# Patient Record
Sex: Female | Born: 1970 | Race: Black or African American | Hispanic: No
Health system: Southern US, Community
[De-identification: ages and names within clinical notes are randomized; demographics above are authoritative.]

## PROBLEM LIST (undated history)

## (undated) DIAGNOSIS — E039 Hypothyroidism, unspecified: Secondary | ICD-10-CM

---

## 2007-03-25 ENCOUNTER — Ambulatory Visit: Payer: Self-pay | Admitting: Advanced Practice Midwife

## 2007-04-15 ENCOUNTER — Ambulatory Visit: Payer: Self-pay | Admitting: Advanced Practice Midwife

## 2007-06-08 ENCOUNTER — Observation Stay: Payer: Self-pay

## 2007-06-16 ENCOUNTER — Observation Stay: Payer: Self-pay

## 2007-06-18 ENCOUNTER — Observation Stay: Payer: Self-pay | Admitting: Obstetrics and Gynecology

## 2007-06-21 ENCOUNTER — Observation Stay: Payer: Self-pay | Admitting: Unknown Physician Specialty

## 2007-06-23 ENCOUNTER — Inpatient Hospital Stay: Payer: Self-pay | Admitting: Unknown Physician Specialty

## 2011-04-05 ENCOUNTER — Emergency Department: Payer: Self-pay | Admitting: Emergency Medicine

## 2014-05-05 ENCOUNTER — Encounter (HOSPITAL_COMMUNITY): Payer: Self-pay | Admitting: Emergency Medicine

## 2014-05-05 ENCOUNTER — Emergency Department (HOSPITAL_COMMUNITY): Payer: Self-pay

## 2014-05-05 ENCOUNTER — Emergency Department (HOSPITAL_COMMUNITY)
Admission: EM | Admit: 2014-05-05 | Discharge: 2014-05-05 | Disposition: A | Discharge status: Home / Self Care | Payer: Self-pay | Attending: Emergency Medicine | Admitting: Emergency Medicine

## 2014-05-05 DIAGNOSIS — Y998 Other external cause status: Secondary | ICD-10-CM | POA: Insufficient documentation

## 2014-05-05 DIAGNOSIS — T1490XA Injury, unspecified, initial encounter: Secondary | ICD-10-CM

## 2014-05-05 DIAGNOSIS — Y9301 Activity, walking, marching and hiking: Secondary | ICD-10-CM | POA: Insufficient documentation

## 2014-05-05 DIAGNOSIS — W1842XA Slipping, tripping and stumbling without falling due to stepping into hole or opening, initial encounter: Secondary | ICD-10-CM | POA: Insufficient documentation

## 2014-05-05 DIAGNOSIS — Z8639 Personal history of other endocrine, nutritional and metabolic disease: Secondary | ICD-10-CM | POA: Insufficient documentation

## 2014-05-05 DIAGNOSIS — S93402A Sprain of unspecified ligament of left ankle, initial encounter: Secondary | ICD-10-CM | POA: Insufficient documentation

## 2014-05-05 DIAGNOSIS — Y92410 Unspecified street and highway as the place of occurrence of the external cause: Secondary | ICD-10-CM | POA: Insufficient documentation

## 2014-05-05 HISTORY — DX: Hypothyroidism, unspecified: E03.9

## 2014-05-05 MED ORDER — MELOXICAM 7.5 MG PO TABS: 7.5000 mg | ORAL_TABLET | Freq: Every day | ORAL | Status: AC

## 2014-05-05 MED ORDER — IBUPROFEN 800 MG PO TABS
800.0000 mg | ORAL_TABLET | Freq: Once | ORAL | Status: AC
  Administered 2014-05-05: 800 mg via ORAL
  Filled 2014-05-05: qty 1

## 2014-05-05 NOTE — ED Notes (Signed)
Bed: WA01 Expected date:  Expected time:  Means of arrival:  Comments: EMS-ankle injury

## 2014-05-05 NOTE — ED Provider Notes (Signed)
CSN: [id number]     Arrival date & time 05/05/14  [id number] History   First MD Initiated Contact with Patient 05/05/14 1853     Chief Complaint  Patient presents with  . Ankle Injury   HPI  Patient is a 43 year old female who presents emergency room for evaluation of left ankle pain. Patient states that she was walking in the street when she stepped in hole and twisted her ankle. She states that she is unable to walk on it since that time but she has had some pain since then. Patient states that the pain is mostly over the ankle bones. Patient states that she has a history of several ankle sprains. She states that her pain is 10. She has no other injuries that she reports. She does not have a PCP. Patient is requesting some information to follow up with a PCP.  Past Medical History  Diagnosis Date  . Hypothyroidism    No past surgical history on file. No family history on file. History  Substance Use Topics  . Smoking status: Not on file  . Smokeless tobacco: Not on file  . Alcohol Use: Not on file   OB History    No data available     Review of Systems  Constitutional: Negative for fever, chills and fatigue.  Musculoskeletal: Positive for joint swelling, arthralgias and gait problem.  Neurological: Negative for numbness.  All other systems reviewed and are negative.     Allergies  Review of patient's allergies indicates no known allergies.  Home Medications   Prior to Admission medications   Medication Sig Start Date End Date Taking? Authorizing Provider  meloxicam (MOBIC) 7.5 MG tablet Take 1 tablet (7.5 mg total) by mouth daily. 05/05/14   Kowen Kluth A Forcucci, PA-C   BP 136/88 mmHg  Pulse 80  Temp(Src) 98.1 F (36.7 C)  Resp 18  SpO2 98%  LMP 04/24/2014 Physical Exam  Constitutional: She is oriented to person, place, and time. She appears well-developed and well-nourished. No distress.  HENT:  Head: Normocephalic and atraumatic.  Mouth/Throat: Oropharynx is clear  and moist. No oropharyngeal exudate.  Eyes: Conjunctivae and EOM are normal. Pupils are equal, round, and reactive to light. No scleral icterus.  Neck: Normal range of motion. Neck supple. No JVD present. No thyromegaly present.  Cardiovascular: Normal rate, regular rhythm, normal heart sounds and intact distal pulses.  Exam reveals no gallop and no friction rub.   No murmur heard. Pulses:      Dorsalis pedis pulses are 2+ on the left side.       Posterior tibial pulses are 2+ on the left side.  Pulmonary/Chest: Effort normal and breath sounds normal. No respiratory distress. She has no wheezes. She has no rales. She exhibits no tenderness.  Musculoskeletal:       Left ankle: She exhibits decreased range of motion and swelling. She exhibits no ecchymosis, no deformity, no laceration and normal pulse. Tenderness. Lateral malleolus, medial malleolus, AITFL and CF ligament tenderness found. No posterior TFL, no head of 5th metatarsal and no proximal fibula tenderness found. Achilles tendon normal.  Lymphadenopathy:    She has no cervical adenopathy.  Neurological: She is alert and oriented to person, place, and time.  Skin: Skin is warm and dry. She is not diaphoretic.  Psychiatric: She has a normal mood and affect. Her behavior is normal. Judgment and thought content normal.  Nursing note and vitals reviewed.   ED Course  Procedures (including critical care time)  Labs Review Labs Reviewed - No data to display  Imaging Review Dg Ankle Complete Left  05/05/2014   CLINICAL DATA:  Twisted ankle today, stepped in a hole  EXAM: LEFT ANKLE COMPLETE - 3+ VIEW  COMPARISON:  None.  FINDINGS: Three views of the left ankle submitted. No acute fracture or subluxation. Ankle mortise is preserved.  IMPRESSION: Negative.   Electronically Signed   By: Natasha MeadLiviu  Pop M.D.   On: 05/05/2014 19:58     EKG Interpretation None      MDM   Final diagnoses:  Injury  Left ankle sprain, initial encounter    Patient is a [age] year old female who presents emergency room for evaluation of left ankle pain. Physical exam reveals neurovascularly intact left ankle. Plain film x-rays are negative. Suspect that this is likely a ankle sprain. We'll place patient in an ASO brace and give crutches. Patient can weight-bear as tolerated. Patient to be given [facility] [place]. Patient given prescription for ibuprofen. Patient follow-up with her PCP. I have encouraged rice therapy. Patient states understanding and agreement at this time. Patient stable for discharge.    Eben Burowourtney A Forcucci, PA-C 05/05/14 2053  Gilda Creasehristopher J. Pollina, MD 05/05/14 2109

## 2014-05-05 NOTE — ED Notes (Signed)
Pt BIB EMS. Pt was walking along the road and stepped in a hole and injured her L ankle. Pt ambulated back home and called EMS. Pt has no deformity or swelling to L ankle. Rates pain 7/10. Ambulated to ambulance without difficulty per EMS.

## 2014-05-05 NOTE — Discharge Instructions (Signed)
Ankle Sprain °An ankle sprain is an injury to the strong, fibrous tissues (ligaments) that hold the bones of your ankle joint together.  °CAUSES °An ankle sprain is usually caused by a fall or by twisting your ankle. Ankle sprains most commonly occur when you step on the outer edge of your foot, and your ankle turns inward. People who participate in sports are more prone to these types of injuries.  °SYMPTOMS  °· Pain in your ankle. The pain may be present at rest or only when you are trying to stand or walk. °· Swelling. °· Bruising. Bruising may develop immediately or within 1 to 2 days after your injury. °· Difficulty standing or walking, particularly when turning corners or changing directions. °DIAGNOSIS  °Your caregiver will ask you details about your injury and perform a physical exam of your ankle to determine if you have an ankle sprain. During the physical exam, your caregiver will press on and apply pressure to specific areas of your foot and ankle. Your caregiver will try to move your ankle in certain ways. An X-ray exam may be done to be sure a bone was not broken or a ligament did not separate from one of the bones in your ankle (avulsion fracture).  °TREATMENT  °Certain types of braces can help stabilize your ankle. Your caregiver can make a recommendation for this. Your caregiver may recommend the use of medicine for pain. If your sprain is severe, your caregiver may refer you to a surgeon who helps to restore function to parts of your skeletal system (orthopedist) or a physical therapist. °HOME CARE INSTRUCTIONS  °· Apply ice to your injury for 1-2 days or as directed by your caregiver. Applying ice helps to reduce inflammation and pain. °¨ Put ice in a plastic bag. °¨ Place a towel between your skin and the bag. °¨ Leave the ice on for 15-20 minutes at a time, every 2 hours while you are awake. °· Only take over-the-counter or prescription medicines for pain, discomfort, or fever as directed by  your caregiver. °· Elevate your injured ankle above the level of your heart as much as possible for 2-3 days. °· If your caregiver recommends crutches, use them as instructed. Gradually put weight on the affected ankle. Continue to use crutches or a cane until you can walk without feeling pain in your ankle. °· If you have a plaster splint, wear the splint as directed by your caregiver. Do not rest it on anything harder than a pillow for the first 24 hours. Do not put weight on it. Do not get it wet. You may take it off to take a shower or bath. °· You may have been given an elastic bandage to wear around your ankle to provide support. If the elastic bandage is too tight (you have numbness or tingling in your foot or your foot becomes cold and blue), adjust the bandage to make it comfortable. °· If you have an air splint, you may blow more air into it or let air out to make it more comfortable. You may take your splint off at night and before taking a shower or bath. Wiggle your toes in the splint several times per day to decrease swelling. °SEEK MEDICAL CARE IF:  °· You have rapidly increasing bruising or swelling. °· Your toes feel extremely cold or you lose feeling in your foot. °· Your pain is not relieved with medicine. °SEEK IMMEDIATE MEDICAL CARE IF: °· Your toes are numb or blue. °·   You have severe pain that is increasing. °MAKE SURE YOU:  °· Understand these instructions. °· Will watch your condition. °· Will get help right away if you are not doing well or get worse. °Document Released: 05/07/2005 Document Revised: 01/30/2012 Document Reviewed: 05/19/2011 °ExitCare® Patient Information ©2015 [employer]. This information is not intended to replace advice given to you by your health care provider. Make sure you discuss any questions you have with your health care provider. ° ° °Emergency Department Resource Guide °1) Find a Doctor and Pay Out of Pocket °Although you won't have to find out who is covered  by your insurance plan, it is a good idea to ask around and get recommendations. You will then need to call the office and see if the doctor you have chosen will accept you as a new patient and what types of options they offer for patients who are self-pay. Some doctors offer discounts or will set up payment plans for their patients who do not have insurance, but you will need to ask so you aren't surprised when you get to your appointment. ° °2) Contact Your Local Health Department °Not all health departments have doctors that can see patients for sick visits, but many do, so it is worth a call to see if yours does. If you don't know where your local health department is, you can check in your phone book. The CDC also has a tool to help you locate your state's health department, and many state websites also have listings of all of their local health departments. ° °3) Find a Walk-in Clinic °If your illness is not likely to be very severe or complicated, you may want to try a walk in clinic. These are popping up all over the country in pharmacies, drugstores, and shopping centers. They're usually staffed by nurse practitioners or physician assistants that have been trained to treat common illnesses and complaints. They're usually fairly quick and inexpensive. However, if you have serious medical issues or chronic medical problems, these are probably not your best option. ° °No Primary Care Doctor: °- Call [facility] at  832-8000 - they can help you locate a primary care doctor that  accepts your insurance, provides certain services, etc. °- Physician Referral Service- 1-800-533-3463 ° °Chronic Pain Problems: °Organization         Address  Phone   Notes  °[facility]  (336) 297-2271 Patients need to be referred by their primary care doctor.  ° °Medication Assistance: °Organization         Address  Phone   Notes  °[place] [facility] [address] °[place], [place] [address] (336) 641-8030 --Must be a resident of [place] °-- Must have NO insurance coverage whatsoever (no Medicaid/ Medicare, etc.) °-- The pt. MUST have a primary care doctor that directs their care regularly and follows them in the community °  °[facility]  (866) 331-1348   °[employer]  (888) 892-1162   ° °Agencies that provide inexpensive medical care: °Organization         Address  Phone   Notes  °[facility]  (336) 832-8035   °[facility]    (336) 832-7272   °[facility] [address] °[place], [place] [address] (336) 832-4777   °[facility] [address], °[place] (336) 271-4999   °[facility]    (336) 373-0678   °[facility]    (  336) 272-1050   °[facility] ° [address], [place] Phone:  (336) 832-4444, Fax:  (336) 832-4440 Hours of Operation:  9 am - 6 pm, M-F.  Also accepts Medicaid/Medicare and self-pay.  °[facility] ° [address], [place] Phone: (336) 832-3150, Fax: (336) 832-3151. Hours of Operation:  8:30 am - 5:30 pm, M-F.  Also accepts Medicaid and self-pay.  °[facility] [place] [address], [place] Phone: (336) 878-6027   °[facility] [address], [place], [place] (336)723-1848, Ext. 123 Mondays & Thursdays: 7-9 AM.  First 15 patients are seen on a first come, first serve basis. °  ° °Medicaid-accepting [place] Providers: ° °Organization         Address  Phone   Notes  °[facility] [address], [place] (336) 641-2100 Also accepts self-pay patients.  °[facility] [address], [place] ° (336) 856-9996   °[facility] [address], [place] (336) 288-8857   °[facility] [address] [place] [address], [place] (336) 299-7000   °Veita Bland [address], [place]  ° (336) 373-1557 Only accepts [place] Access Medicaid patients after they have their name applied to their card.  ° °Self-Pay (no insurance) in [place]: ° °Organization         Address  Phone   Notes  °Sickle Cell Patients, [facility] [address], [place] (336) 832-1970   °[facility] [address], [place] (336) 832-4400   °[facility] ° [address], [place] (336) 992-4800   °[facility]/Dr. Osei-Bonsu ° [address] [place] [address], [place] or [address], [place] (336) 841-8500 Phone number for both [place] and [place] locations is the same.  °[facility] [address], [place] (336) 299-0000   °[facility] [address] [place] [address], [place] or [address] (336) 852-7530 °(336) 878-2260   °[facility] [address],  (336) 350-1642, phone; (336) 294-5005, fax Sees patients 1st and 3rd Saturday of every month.  Must not qualify for public or private insurance (i.e. Medicaid, Medicare, [place] Health Choice, Veterans' Benefits) • Household income should be no more than 200% of the poverty level •The clinic cannot treat you if you are pregnant or think you are pregnant • Sexually transmitted diseases are not treated at the clinic.  ° ° °Dental Care: °Organization         Address  Phone  Notes  °[facility] [address],  (336) 641-6152 Accepts children up to age 21 who are enrolled in Medicaid or [place] Health Choice; pregnant women with a Medicaid card; and children who have applied for Medicaid or [place] Health Choice, but were declined, whose parents can pay a reduced fee at time of service.  °[facility] [place]  [address], [place] (336) 641-7733 Accepts children up to age 21 who are enrolled  in Medicaid or  Health Choice; pregnant women with a Medicaid card; and children who have applied for Medicaid or  Health Choice, but were declined, whose parents can pay a reduced fee at time of service.  °[facility] ° [address], [place] (336) 641-4533 Patients are seen by appointment only. Walk-ins are not accepted. [facility] will see patients 18 years of age and older. °Monday - Tuesday (8am-5pm) °Most Wednesdays (8:30-5pm) °$30 per visit, cash only  °[facility] ° [address], [place] (336) 641-4533 Patients are seen by appointment only. Walk-ins are not accepted. [facility] will see patients 18 years of age and older. °One Wednesday Evening (Monthly: Volunteer Based).  $30 per visit, cash only  °[facility]  (919) 537-3737 for adults; Children under age 4, call Graduate Pediatric Dentistry at (919) [phone]. Children aged 4-14, please call (919) [phone] to request a pediatric application. ° Dental services are provided in all areas of dental care including fillings, crowns and bridges, complete and partial dentures, implants, gum treatment, root canals, and extractions. Preventive care is also provided. Treatment is provided to both adults and children. °Patients are selected via a lottery and there is often a waiting list. °  °[facility] [address], °[place] ° (336) 763-8833 [email or web address] °  °[facility] [address], [place], [place] (336)723-1848, Ext. 123 Second and Fourth Thursday of each month, opens at 6:30 AM; Clinic ends at 9 AM.  Patients are seen on a first-come first-served basis, and a limited number are seen during each clinic.  ° °[facility] ° [address], [place], [place] (336) 723-7904   Eligibility Requirements °You must have lived in [place], [place], or [place] counties for at least the last three months. °  You cannot be  eligible for state or federal sponsored healthcare insurance, including Veterans Administration, Medicaid, or Medicare. °  You generally cannot be eligible for healthcare insurance through your employer.  °  How to apply: °Eligibility screenings are held every Tuesday and Wednesday afternoon from 1:00 pm until 4:00 pm. You do not need an appointment for the interview!  °[facility] [address], [place], [place] 336-631-2330   °[facility]  336-342-8273   °[facility]  336-703-3100   °[facility]  336-570-6415   ° °Behavioral Health Resources in the Community: °Intensive Outpatient Programs °Organization         Address  Phone  Notes  °[facility] [address], [place], [place] 336-878-6098   °[facility] [address], [place], [place] 336-832-9800   °[facility] [address], [place], [place] ° 336-882-2125   °[facility] [address],  °[place], [place] 1-800-853-5163 or 336-641-4981   °Substance Abuse Resources °Organization         Address  Phone  Notes  °[facility]  336-882-2125   °[facility]  336-784-9470   °[facility]  336-285-9073   °[facility]  336-845-3988   °Residential & Outpatient Substance Abuse Program  1-800-659-3381   °Psychological Services °Organization         Address  Phone  Notes  °[facility]  336- 832-9600   °[facility]  336- 378-7881   °[facility] [address], [place] 1-800-853-5163 or 336-641-4981   ° °Mobile Crisis Teams °Organization         Address  Phone  Notes  °[facility], Mobile Crisis Care Unit  1-877-626-1772   °Assertive °[facility] ° [address] [place],    336-834-9664   °Sharon DeEsch [address] °[place] 336-554-5454   ° °Self-Help/Support Groups °Organization          Address  Phone             Notes  °[facility] [place] - variety of support groups  336- 373-1402 Call for more information  °Narcotics Anonymous (NA), [facility] [address], °[place]  2 meetings at this location  ° °Residential Treatment Programs °Organization         Address  Phone  Notes  °[facility] [address],    °[place]  1-866-801-8205   °[facility] ° [address], [place], [place] 704-293-8524   °[facility] [address], [place] 336-845-3988 Admissions: 8am-3pm M-F  °[facility] [address]    °[place], [place] 336-841-1104   °[facility] [address], [place], [place] 336-379-7146   °[facility] [address]  °[place], [place] 336-285-9073   °[facility] Intensive Outpatient [address], [place], [place] 336-852-3033   °[facility] [address]  °[place], [place] 1-877-615-2722 or 336-784-9470   °[facility] [address] [place], [place] 336-227-7417 Accepts Medicaid  °[facility] [address]  °[place] 1-800-659-3381 Substance Abuse/Addiction Treatment  ° °[place] Behavioral Health Resources °Organization         Address  Phone  Notes  °[facility]  (888) 581-9988   °Julie Brannon, PhD [address] [place], [place]   (336) 349-5553 or (336) 951-0000   °[facility]   [address] °[place], [place] (336) 349-4454   °[facility] [address], [place], [place] (336) 342-8316 Insurance/Medicaid/sponsorship through [facility]  °[facility] [address]                                    [place], [place] (336) 342-8316 Therapy/tele-psych/case  °[facility] [address]  ° [place],  (336) 349-2233    °Dr. Arfeen  (336) 349-4544   °[facility] [place]  [employer]  [place] [facility] 1) [address], [place] °2) [address], [place] °3)  [address], [place] (336) 349-3220 °(336) 342-7768 ° °(336) 342-8140   °[place] [facility] (336) 342-1394 or (336) 342-3537 (After Hours)    ° ° ° °

## 2017-07-29 ENCOUNTER — Encounter (HOSPITAL_COMMUNITY): Payer: Self-pay

## 2017-07-29 ENCOUNTER — Other Ambulatory Visit: Payer: Self-pay

## 2017-07-29 ENCOUNTER — Emergency Department (HOSPITAL_COMMUNITY)
Admission: EM | Admit: 2017-07-29 | Discharge: 2017-07-29 | Disposition: A | Discharge status: Home / Self Care | Payer: Self-pay | Attending: Emergency Medicine | Admitting: Emergency Medicine

## 2017-07-29 ENCOUNTER — Emergency Department (HOSPITAL_COMMUNITY): Payer: Self-pay

## 2017-07-29 DIAGNOSIS — R197 Diarrhea, unspecified: Secondary | ICD-10-CM | POA: Insufficient documentation

## 2017-07-29 DIAGNOSIS — M25561 Pain in right knee: Secondary | ICD-10-CM | POA: Insufficient documentation

## 2017-07-29 DIAGNOSIS — F17200 Nicotine dependence, unspecified, uncomplicated: Secondary | ICD-10-CM | POA: Insufficient documentation

## 2017-07-29 DIAGNOSIS — R110 Nausea: Secondary | ICD-10-CM | POA: Insufficient documentation

## 2017-07-29 DIAGNOSIS — R109 Unspecified abdominal pain: Secondary | ICD-10-CM | POA: Insufficient documentation

## 2017-07-29 LAB — URINALYSIS, ROUTINE W REFLEX MICROSCOPIC
Bilirubin Urine: NEGATIVE
GLUCOSE, UA: NEGATIVE mg/dL
Ketones, ur: NEGATIVE mg/dL
Nitrite: NEGATIVE
PH: 5 (ref 5.0–8.0)
Protein, ur: NEGATIVE mg/dL
Specific Gravity, Urine: 1.027 (ref 1.005–1.030)

## 2017-07-29 LAB — COMPREHENSIVE METABOLIC PANEL
ALBUMIN: 3.4 g/dL — AB (ref 3.5–5.0)
ALK PHOS: 47 U/L (ref 38–126)
ALT: 14 U/L (ref 14–54)
AST: 18 U/L (ref 15–41)
Anion gap: 8 (ref 5–15)
BILIRUBIN TOTAL: 0.7 mg/dL (ref 0.3–1.2)
BUN: 13 mg/dL (ref 6–20)
CALCIUM: 8.4 mg/dL — AB (ref 8.9–10.3)
CO2: 23 mmol/L (ref 22–32)
CREATININE: 0.83 mg/dL (ref 0.44–1.00)
Chloride: 104 mmol/L (ref 101–111)
GFR calc Af Amer: >60 mL/min (ref >60)
GFR calc non Af Amer: >60 mL/min (ref >60)
GLUCOSE: 112 mg/dL — AB (ref 65–99)
Potassium: 3.9 mmol/L (ref 3.5–5.1)
Sodium: 135 mmol/L (ref 135–145)
TOTAL PROTEIN: 7.7 g/dL (ref 6.5–8.1)

## 2017-07-29 LAB — I-STAT BETA HCG BLOOD, ED (MC, WL, AP ONLY): I-stat hCG, quantitative: <5 m[IU]/mL (ref <5)

## 2017-07-29 LAB — CBC
HCT: 36.7 % (ref 36.0–46.0)
Hemoglobin: 11.7 g/dL — ABNORMAL LOW (ref 12.0–15.0)
MCH: 27.5 pg (ref 26.0–34.0)
MCHC: 31.9 g/dL (ref 30.0–36.0)
MCV: 86.2 fL (ref 78.0–100.0)
PLATELETS: 266 10*3/uL (ref 150–400)
RBC: 4.26 MIL/uL (ref 3.87–5.11)
RDW: 16.1 % — AB (ref 11.5–15.5)
WBC: 5 10*3/uL (ref 4.0–10.5)

## 2017-07-29 LAB — LIPASE, BLOOD: Lipase: 38 U/L (ref 11–51)

## 2017-07-29 MED ORDER — IBUPROFEN 400 MG PO TABS: 400.0000 mg | ORAL_TABLET | Freq: Three times a day (TID) | ORAL | 0 refills | Status: AC | PRN

## 2017-07-29 MED ORDER — PREDNISONE 20 MG PO TABS
60.0000 mg | ORAL_TABLET | Freq: Once | ORAL | Status: AC
  Administered 2017-07-29: 60 mg via ORAL
  Filled 2017-07-29: qty 3

## 2017-07-29 MED ORDER — ONDANSETRON 4 MG PO TBDP
8.0000 mg | ORAL_TABLET | Freq: Once | ORAL | Status: AC
Start: 2017-07-29 — End: 2017-07-29
  Administered 2017-07-29: 8 mg via ORAL
  Filled 2017-07-29: qty 2

## 2017-07-29 MED ORDER — ONDANSETRON 8 MG PO TBDP: 8.0000 mg | ORAL_TABLET | Freq: Three times a day (TID) | ORAL | 0 refills | Status: AC | PRN

## 2017-07-29 MED ORDER — PREDNISONE 20 MG PO TABS: 40.0000 mg | ORAL_TABLET | Freq: Every day | ORAL | 0 refills | Status: AC

## 2017-07-29 MED ORDER — IBUPROFEN 400 MG PO TABS
600.0000 mg | ORAL_TABLET | Freq: Once | ORAL | Status: AC
  Administered 2017-07-29: 600 mg via ORAL
  Filled 2017-07-29: qty 1

## 2017-07-29 NOTE — ED Notes (Signed)
Pt states she cannot provide UA sample at this time. UA cup given.

## 2017-07-29 NOTE — ED Notes (Signed)
Pt verbalized understanding discharge instructions and denies any further needs or questions at this time. VS stable, ambulatory and steady gait.   

## 2017-07-29 NOTE — ED Notes (Signed)
No reply x2 for vitals. 

## 2017-07-29 NOTE — ED Triage Notes (Signed)
Pt reports right knee pain X3 weeks. Denies injury. Pt also reports abdominal pain and diarrhea that began this morning.

## 2017-07-29 NOTE — ED Provider Notes (Signed)
[facility] EMERGENCY DEPARTMENT Provider Note   CSN: [id number] Arrival date & time: 07/29/17  1307     History   Chief Complaint Chief Complaint  Patient presents with  . Knee Pain  . Abdominal Pain    HPI Cassie Levine is a 47 y.o. female.  HPI 47 year old female presents the emergency department with 3 weeks of ongoing right knee pain.  No injury or trauma.  No history of gout.  No fevers or chills.  She also reports this morning she developed nausea and nonbloody diarrhea.  She denies vomiting.  Reports crampy abdominal pain.  Denies fevers and chills.  No urinary complaints.  Still has menstrual cycles.  She is sexually active.  She does not use birth control.  No pelvic complaints.  No vaginal bleeding.  Pain in her right knee is worse with range of motion of her right knee and ambulation.  She states she has been limping.   Past Medical History:  Diagnosis Date  . Hypothyroidism     There are no active problems to display for this patient.   History reviewed. No pertinent surgical history.  OB History    No data available       Home Medications    Prior to Admission medications   Medication Sig Start Date End Date Taking? Authorizing Provider  ibuprofen (ADVIL,MOTRIN) 400 MG tablet Take 1 tablet (400 mg total) by mouth every 8 (eight) hours as needed. 07/29/17   Azalia Bilis, MD  meloxicam (MOBIC) 7.5 MG tablet Take 1 tablet (7.5 mg total) by mouth daily. 05/05/14   Forcucci, Courtney, PA-C  ondansetron (ZOFRAN ODT) 8 MG disintegrating tablet Take 1 tablet (8 mg total) by mouth every 8 (eight) hours as needed for nausea or vomiting. 07/29/17   Azalia Bilis, MD  predniSONE (DELTASONE) 20 MG tablet Take 2 tablets (40 mg total) by mouth daily for 5 days. 07/29/17 08/03/17  Azalia Bilis, MD    Family History History reviewed. No pertinent family history.  Social History Social History   Tobacco Use  . Smoking status: Current Every  Day Smoker    Packs/day: 0.20  . Smokeless tobacco: Never Used  Substance Use Topics  . Alcohol use: Yes    Comment: occ  . Drug use: No     Allergies   Patient has no known allergies.   Review of Systems Review of Systems  All other systems reviewed and are negative.    Physical Exam Updated Vital Signs BP 130/67   Pulse 98   Temp 98.6 F (37 C) (Oral)   Resp 16   LMP 06/27/2017 (Within Days)   SpO2 97%   Physical Exam  Constitutional: She is oriented to person, place, and time. She appears well-developed and well-nourished. No distress.  HENT:  Head: Normocephalic and atraumatic.  Eyes: EOM are normal.  Neck: Normal range of motion.  Cardiovascular: Normal rate, regular rhythm and normal heart sounds.  Pulmonary/Chest: Effort normal and breath sounds normal.  Abdominal: Soft. She exhibits no distension. There is no tenderness.  Musculoskeletal: Normal range of motion.  Full range of motion of bilateral hips, knees, ankles.  Mild pain with range of motion the right knee without obvious limitation.  Normal pulses in the right foot.  No swelling of the right lower extremity as compared to left.  No significant erythema or warmth of the right knee joint.  Neurological: She is alert and oriented to person, place, and time.  Skin:  Skin is warm and dry.  Psychiatric: She has a normal mood and affect. Judgment normal.  Nursing note and vitals reviewed.    ED Treatments / Results  Labs (all labs ordered are listed, but only abnormal results are displayed) Labs Reviewed  COMPREHENSIVE METABOLIC PANEL - Abnormal; Notable for the following components:      Result Value   Glucose, Bld 112 (*)    Calcium 8.4 (*)    Albumin 3.4 (*)    All other components within normal limits  CBC - Abnormal; Notable for the following components:   Hemoglobin 11.7 (*)    RDW 16.1 (*)    All other components within normal limits  URINALYSIS, ROUTINE W REFLEX MICROSCOPIC - Abnormal;  Notable for the following components:   APPearance HAZY (*)    Hgb urine dipstick SMALL (*)    Leukocytes, UA TRACE (*)    Bacteria, UA FEW (*)    Squamous Epithelial / LPF 6-30 (*)    All other components within normal limits  LIPASE, BLOOD  I-STAT BETA HCG BLOOD, ED ([facility], [facility], [facility] ONLY)    EKG  EKG Interpretation None       Radiology Dg Knee Complete 4 Views Right  Result Date: 07/29/2017 CLINICAL DATA:  Right knee pain x3 weeks EXAM: RIGHT KNEE - COMPLETE 4+ VIEW COMPARISON:  None. FINDINGS: No evidence of fracture, dislocation, or joint effusion. No evidence of arthropathy or other focal bone abnormality. Soft tissues are unremarkable. IMPRESSION: No acute osseous abnormality. Electronically Signed   By: Tollie Ethavid  Kwon M.D.   On: 07/29/2017 14:10    Procedures Procedures (including critical care time)  Medications Ordered in ED Medications  ibuprofen (ADVIL,MOTRIN) tablet 600 mg (not administered)  predniSONE (DELTASONE) tablet 60 mg (not administered)  ondansetron (ZOFRAN-ODT) disintegrating tablet 8 mg (8 mg Oral Given 07/29/17 1346)     Initial Impression / Assessment and Plan / ED Course  I have reviewed the triage vital signs and the nursing notes.  Pertinent labs & imaging results that were available during my care of the patient were reviewed by me and considered in my medical decision making (see chart for details).     Abdominal exam is benign.  Suspect viral illness.  In regards to the 3 weeks of right knee pain which is really what prompted her ER visit today this may represent some inflammatory arthritis.  Doubt septic arthritis.  No prior history of gout.  No significant joint effusion at this time.  X-ray without signs of arthritis or other osseous abnormalities.  Patient will need primary care follow-up.  Final Clinical Impressions(s) / ED Diagnoses   Final diagnoses:  Acute abdominal pain  Nausea  Diarrhea, unspecified type  Acute pain of right knee     ED Discharge Orders        Ordered    ibuprofen (ADVIL,MOTRIN) 400 MG tablet  Every 8 hours PRN     07/29/17 1952    ondansetron (ZOFRAN ODT) 8 MG disintegrating tablet  Every 8 hours PRN     07/29/17 1952    predniSONE (DELTASONE) 20 MG tablet  Daily     07/29/17 1952       Azalia Bilisampos, Aliana Kreischer, MD 07/29/17 1955

## 2017-07-29 NOTE — ED Provider Notes (Signed)
Patient placed in Quick Look pathway, seen and evaluated   Chief Complaint: right knee pain and nausea, abdominal pain, diarrhea.  HPI:   Right knee pain started 3 wks ago, getting worse. Worse with walking. No injuries. Abdominal pain started this morning, reports associated diarrhea, and nausea. No vomiting. Pain all over. Pt has been taking aleve, tylenol for knee with no relief. Has not tried anything for abdominal pain.   ROS: positive for knee pain, abdominal pain, nausea, diarrhea  Negative for fever, chills, vomiting, urinary symptoms.   Physical Exam:   Gen: No distress  Neuro: Awake and Alert  Skin: Warm    Focused Exam: normal appearing knee. Full ROM. Pain with flexion and extension. Joint stable. Abdomen soft, diffusely tender.   Pt with two separate complaint. Knee pain for 3 weeks, no injuries. Xray ordered. No signs of septic joint. Ambulatory. Also complaining of diffuse abdominal pain with nausea and 4 episodes of water diarrhea. No recent antibiotics or travel. States grand kids with similar complaint last week. Most likely viral. Labs ordered. zofran ordered for nausea.    Vitals:   07/29/17 1330  BP: 111/79  Pulse: (!) 104  Resp: 18  Temp: 98.6 F (37 C)  TempSrc: Oral  SpO2: 97%    Initiation of care has begun. The patient has been counseled on the process, plan, and necessity for staying for the completion/evaluation, and the remainder of the medical screening examination    Jaynie CrumbleKirichenko, Mccall Will, Cordelia Poche-C 07/29/17 1345    Azalia Bilisampos, Kevin, MD 07/29/17 1732

## 2018-12-06 ENCOUNTER — Emergency Department (HOSPITAL_COMMUNITY)
Admission: EM | Admit: 2018-12-06 | Discharge: 2018-12-06 | Disposition: A | Discharge status: Home / Self Care | Payer: Self-pay | Attending: Emergency Medicine | Admitting: Emergency Medicine

## 2018-12-06 ENCOUNTER — Encounter (HOSPITAL_COMMUNITY): Payer: Self-pay | Admitting: Emergency Medicine

## 2018-12-06 ENCOUNTER — Other Ambulatory Visit: Payer: Self-pay

## 2018-12-06 DIAGNOSIS — Z79899 Other long term (current) drug therapy: Secondary | ICD-10-CM | POA: Insufficient documentation

## 2018-12-06 DIAGNOSIS — R197 Diarrhea, unspecified: Secondary | ICD-10-CM | POA: Insufficient documentation

## 2018-12-06 DIAGNOSIS — Z20828 Contact with and (suspected) exposure to other viral communicable diseases: Secondary | ICD-10-CM | POA: Insufficient documentation

## 2018-12-06 DIAGNOSIS — R05 Cough: Secondary | ICD-10-CM | POA: Insufficient documentation

## 2018-12-06 DIAGNOSIS — R059 Cough, unspecified: Secondary | ICD-10-CM

## 2018-12-06 DIAGNOSIS — E039 Hypothyroidism, unspecified: Secondary | ICD-10-CM | POA: Insufficient documentation

## 2018-12-06 DIAGNOSIS — F17210 Nicotine dependence, cigarettes, uncomplicated: Secondary | ICD-10-CM | POA: Insufficient documentation

## 2018-12-06 MED ORDER — BENZONATATE 100 MG PO CAPS: 100.0000 mg | ORAL_CAPSULE | Freq: Three times a day (TID) | ORAL | 0 refills | Status: AC

## 2018-12-06 MED ORDER — BENZONATATE 100 MG PO CAPS: 100.0000 mg | ORAL_CAPSULE | Freq: Three times a day (TID) | ORAL | 0 refills | Status: DC

## 2018-12-06 NOTE — Discharge Instructions (Signed)
Please read and follow all provided instructions.  Your diagnoses today include:  1. Cough   2. Diarrhea, unspecified type     Tests performed today include:  Covid test - should return in 48-72 hours.  Vital signs. See below for your results today.   Medications prescribed:   Tessalon Perles - cough suppressant medication  Take any prescribed medications only as directed.  Home care instructions:  Follow any educational materials contained in this packet.  Please isolate yourself until your coronavirus test returns.  You may find your results on MyChart.  If he test positive you need to isolate for at least 10 days with at least 72 hours of improvement.  BE VERY CAREFUL not to take multiple medicines containing Tylenol (also called acetaminophen). Doing so can lead to an overdose which can damage your liver and cause liver failure and possibly death.   Follow-up instructions: Please follow-up with your primary care provider as needed for symptoms.   Return instructions:   Please return to the Emergency Department if you experience worsening symptoms.   Return with worsening chest pain, shortness of breath, trouble breathing, vomiting.  Please return if you have any other emergent concerns.  Additional Information:  Your vital signs today were: BP 138/80 (BP Location: Right Arm)    Pulse 89    Temp 98.1 F (36.7 C) (Oral)    Resp 16    Ht 5\' 9"  (1.753 m)    Wt 135.5 kg    LMP 12/05/2018    SpO2 100%    BMI 44.11 kg/m  If your blood pressure (BP) was elevated above 135/85 this visit, please have this repeated by your doctor within one month. --------------

## 2018-12-06 NOTE — ED Triage Notes (Signed)
Pt states she got her hair done on Tuesday and the salon did not require face masks. She has been coughing with runny nose and diarrhea since yesterday and feels she may have covid.

## 2018-12-06 NOTE — ED Provider Notes (Signed)
[facility] EMERGENCY DEPARTMENT Provider Note   CSN: [id number] Arrival date & time: 12/06/18  1659     History   Chief Complaint Chief Complaint  Patient presents with  . Cough    HPI Cassie Levine is a 48 y.o. female.     Patient with no past pulmonary history presents the emergency department with 3 days of cough, nasal congestion, runny nose, and diarrhea.  Patient has not had a thermometer to check her temperature but has felt warm and cold at times.  No shaking chills.  No ear pain or sore throat.  No chest pain or shortness of breath, particularly with exertion.  She denies any nausea or vomiting.  No urinary symptoms or skin rash.  Patient was at the hairdresser's the other day and states that no one was wearing a mask.  She has had no specific exposures to coronavirus.  Nobody else at home is sick.  No treatments prior to arrival.     Past Medical History:  Diagnosis Date  . Hypothyroidism     There are no active problems to display for this patient.   History reviewed. No pertinent surgical history.   OB History   No obstetric history on file.      Home Medications    Prior to Admission medications   Medication Sig Start Date End Date Taking? Authorizing Provider  benzonatate (TESSALON) 100 MG capsule Take 1 capsule (100 mg total) by mouth every 8 (eight) hours. 12/06/18   Renne CriglerGeiple, Latravia [place], PA-C  ibuprofen (ADVIL,MOTRIN) 400 MG tablet Take 1 tablet (400 mg total) by mouth every 8 (eight) hours as needed. 07/29/17   Azalia Bilisampos, Kevin, MD  meloxicam (MOBIC) 7.5 MG tablet Take 1 tablet (7.5 mg total) by mouth daily. 05/05/14   Shirleen Schirmerllis, Courtney, PA-C  ondansetron (ZOFRAN ODT) 8 MG disintegrating tablet Take 1 tablet (8 mg total) by mouth every 8 (eight) hours as needed for nausea or vomiting. 07/29/17   Azalia Bilisampos, Kevin, MD    Family History History reviewed. No pertinent family history.  Social History Social History   Tobacco Use  . Smoking status: Current  Every Day Smoker    Packs/day: 0.20  . Smokeless tobacco: Never Used  Substance Use Topics  . Alcohol use: Yes    Comment: occ  . Drug use: No     Allergies   Patient has no known allergies.   Review of Systems Review of Systems  Constitutional: Negative for chills and fever.  HENT: Positive for congestion, rhinorrhea and sneezing. Negative for sore throat.   Eyes: Negative for redness.  Respiratory: Positive for cough.   Cardiovascular: Negative for chest pain.  Gastrointestinal: Positive for diarrhea. Negative for abdominal pain, nausea and vomiting.  Genitourinary: Negative for dysuria.  Musculoskeletal: Negative for myalgias.  Skin: Negative for rash.  Neurological: Negative for headaches.     Physical Exam Updated Vital Signs BP 138/80 (BP Location: Right Arm)   Pulse 89   Temp 98.1 F (36.7 C) (Oral)   Resp 16   Ht 5\' 9"  (1.753 m)   Wt 135.5 kg   LMP 12/05/2018   SpO2 100%   BMI 44.11 kg/m   Physical Exam Vitals signs and nursing note reviewed.  Constitutional:      Appearance: She is well-developed.  HENT:     Head: Normocephalic and atraumatic.     Jaw: No trismus.     Right Ear: Tympanic membrane, ear canal and external ear normal.  Left Ear: Tympanic membrane, ear canal and external ear normal.     Nose: Mucosal edema and rhinorrhea present.     Mouth/Throat:     Mouth: Mucous membranes are not dry. No oral lesions.     Pharynx: Uvula midline. No oropharyngeal exudate, posterior oropharyngeal erythema or uvula swelling.     Tonsils: No tonsillar abscesses.  Eyes:     General:        Right eye: No discharge.        Left eye: No discharge.     Conjunctiva/sclera: Conjunctivae normal.  Neck:     Musculoskeletal: Normal range of motion and neck supple.  Cardiovascular:     Rate and Rhythm: Normal rate and regular rhythm.     Heart sounds: Normal heart sounds.  Pulmonary:     Effort: Pulmonary effort is normal. No respiratory distress.      Breath sounds: Normal breath sounds. No wheezing or rales.  Abdominal:     Palpations: Abdomen is soft.     Tenderness: There is no abdominal tenderness.  Lymphadenopathy:     Cervical: No cervical adenopathy.  Skin:    General: Skin is warm and dry.  Neurological:     Mental Status: She is alert.      ED Treatments / Results  Labs (all labs ordered are listed, but only abnormal results are displayed) Labs Reviewed  NOVEL CORONAVIRUS, NAA (HOSPITAL ORDER, SEND-OUT TO REF LAB)    EKG None  Radiology No results found.  Procedures Procedures (including critical care time)  Medications Ordered in ED Medications - No data to display   Initial Impression / Assessment and Plan / ED Course  I have reviewed the triage vital signs and the nursing notes.  Pertinent labs & imaging results that were available during my care of the patient were reviewed by me and considered in my medical decision making (see chart for details).        Patient seen and examined. Patient appears well.  She has likely a routine upper respiratory virus but cannot rule out coronavirus given her GI symptoms.  No significant shortness of breath or fever here today.  Will treat URI and cough symptomatically with Tessalon and OTC meds as desired.  Will send send-out coronavirus testing.  Patient counseled to isolate until results return.  We discussed the 10+3 rule if she tests positive.  Encouraged return to the emergency department with worsening shortness of breath, trouble breathing, vomiting, high fever.  Vital signs reviewed and are as follows: BP 138/80 (BP Location: Right Arm)   Pulse 89   Temp 98.1 F (36.7 C) (Oral)   Resp 16   Ht 5\' 9"  (1.753 m)   Wt 135.5 kg   LMP 12/05/2018   SpO2 100%   BMI 44.11 kg/m     Final Clinical Impressions(s) / ED Diagnoses   Final diagnoses:  Cough  Diarrhea, unspecified type   Well-appearing patient with URI symptoms, diarrhea.  Rule out COVID-19.   Otherwise treat symptoms symptomatically.  Return instructions as above.  Low concern for pneumonia at this point.  Patient is not febrile or hypoxic.  No signs of sepsis.  ED Discharge Orders         Ordered    benzonatate (TESSALON) 100 MG capsule  Every 8 hours     12/06/18 1749           Carlisle Cater, PA-C 12/06/18 Niotaze, [place], DO 12/07/18 1529

## 2018-12-08 LAB — NOVEL CORONAVIRUS, NAA (HOSP ORDER, SEND-OUT TO REF LAB; TAT 18-24 HRS): SARS-CoV-2, NAA: NOT DETECTED

## 2019-06-15 ENCOUNTER — Other Ambulatory Visit: Payer: Self-pay

## 2019-07-23 ENCOUNTER — Ambulatory Visit: Payer: Self-pay | Attending: Internal Medicine

## 2020-01-07 IMAGING — DX DG KNEE COMPLETE 4+V*R*
4 series · 4 of 4 positions shown · non-contrast
Comparison: None.

CLINICAL DATA: Right knee pain x3 weeks

EXAM:
RIGHT KNEE - COMPLETE 4+ VIEW

[t knee ap right]
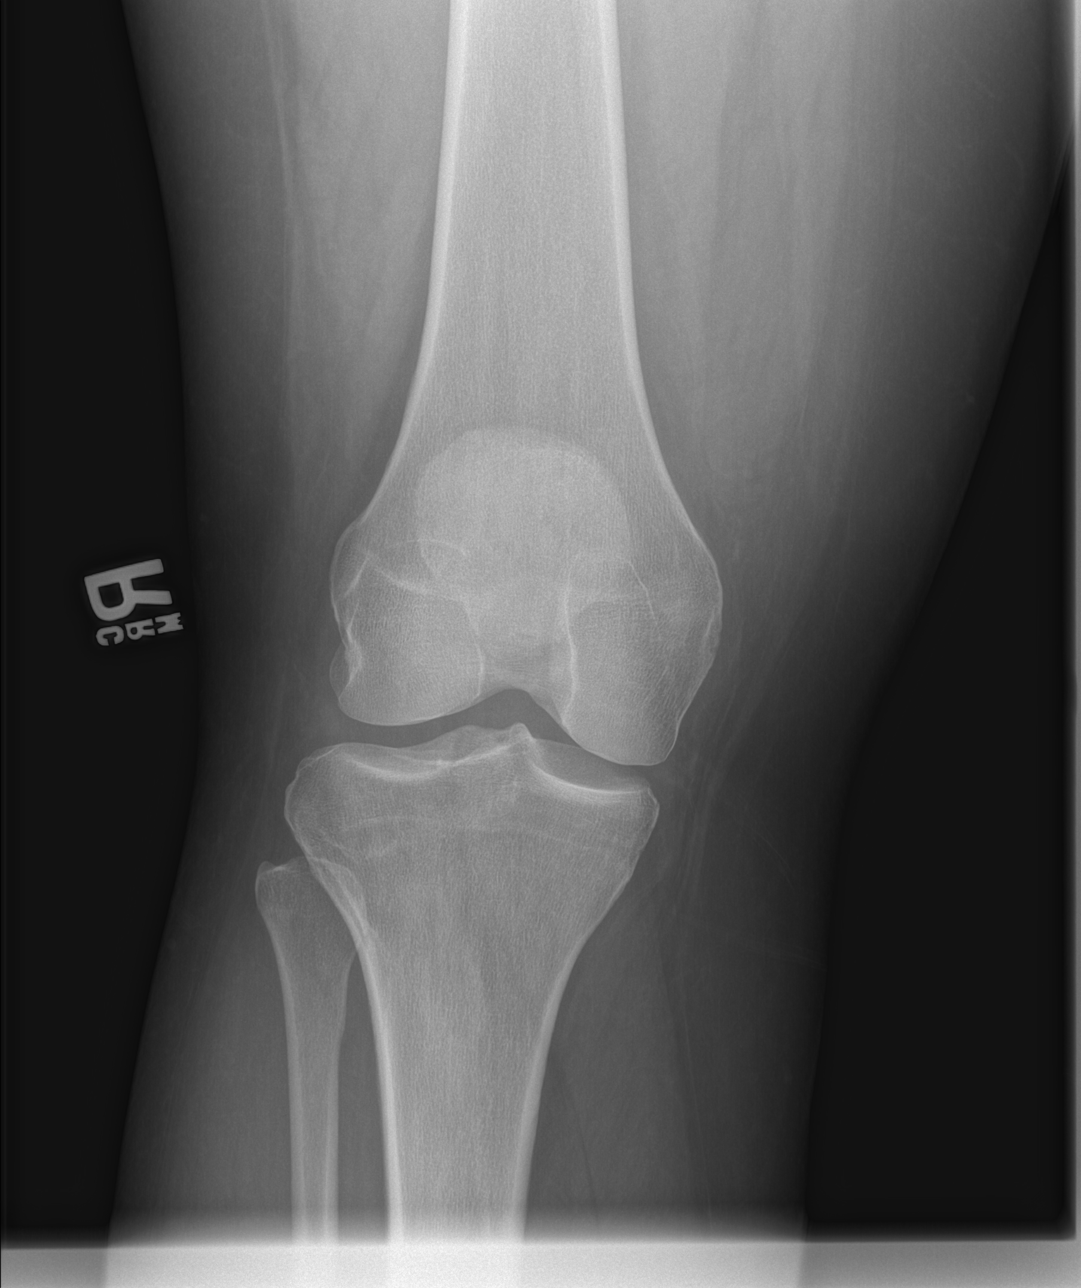

[t knee obl right (1 of 2)]
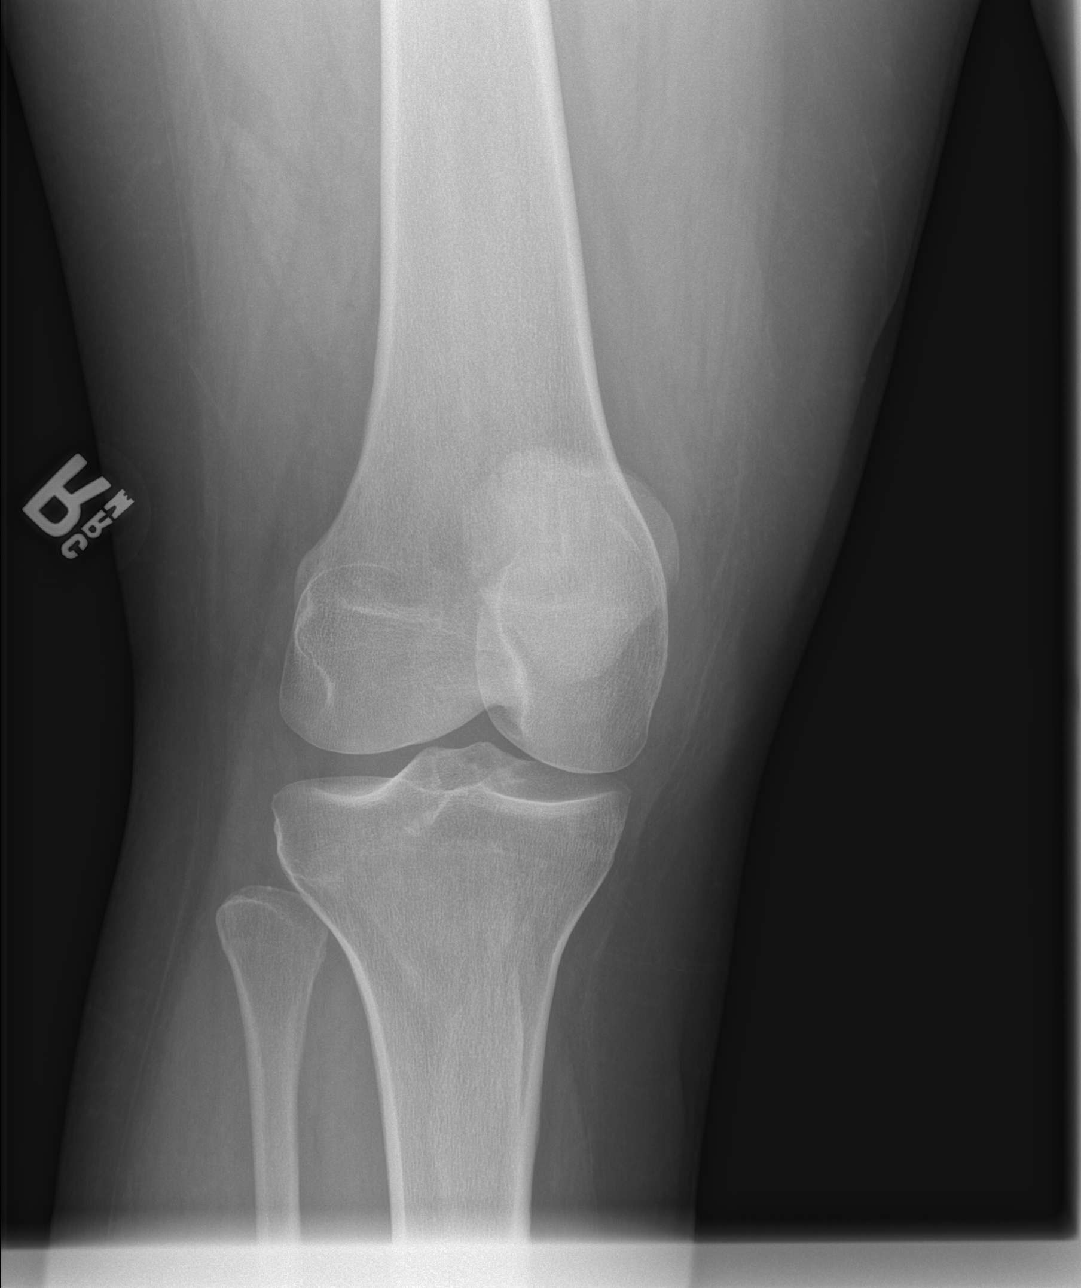

[t knee obl right (2 of 2)]
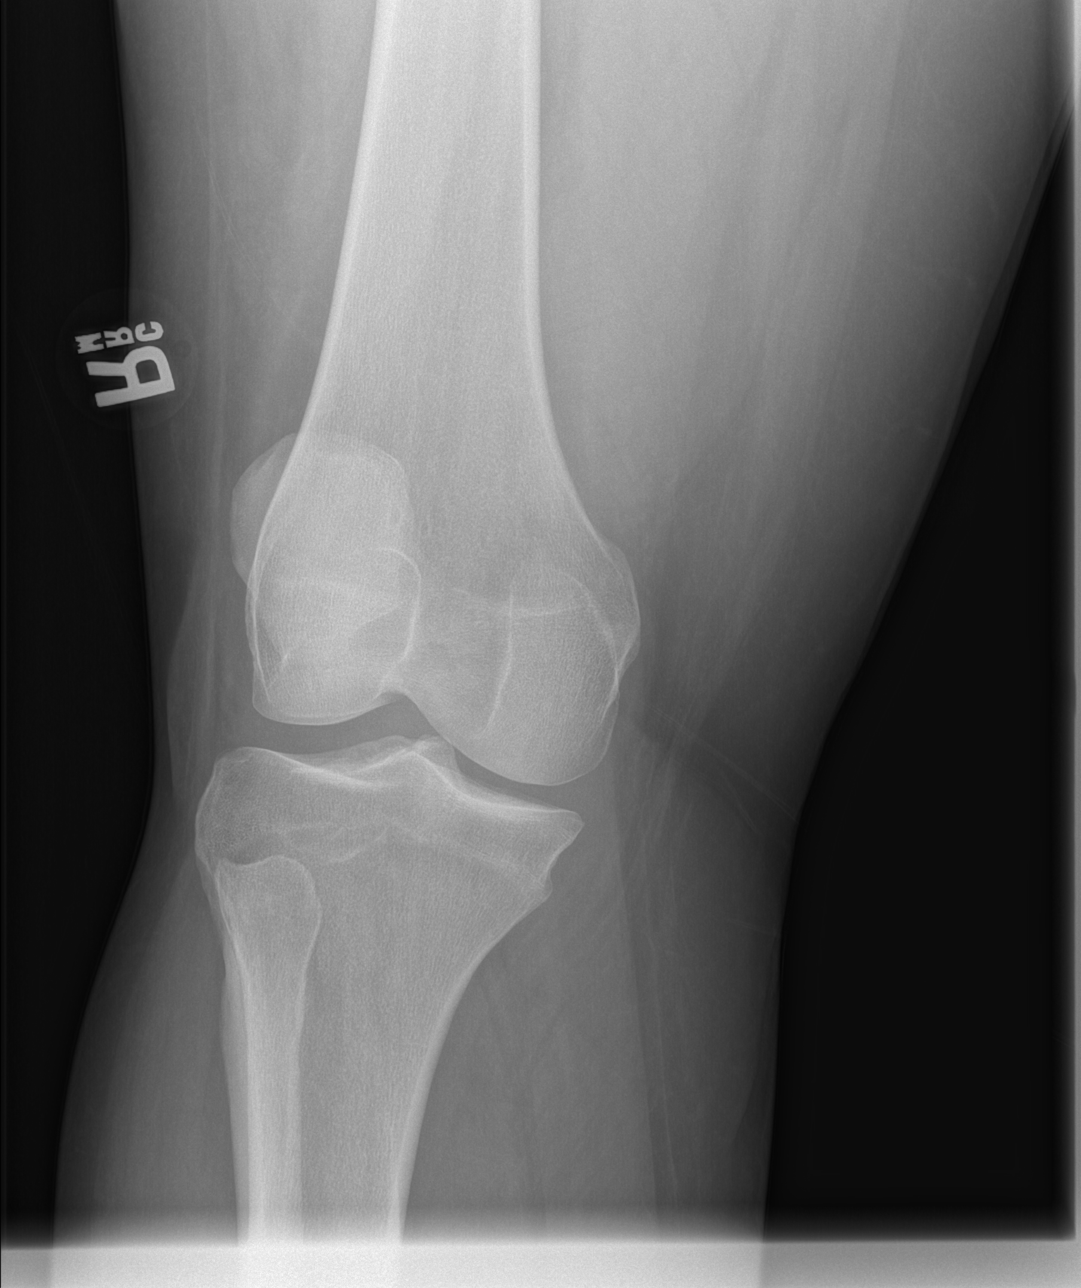

[t knee lat right]
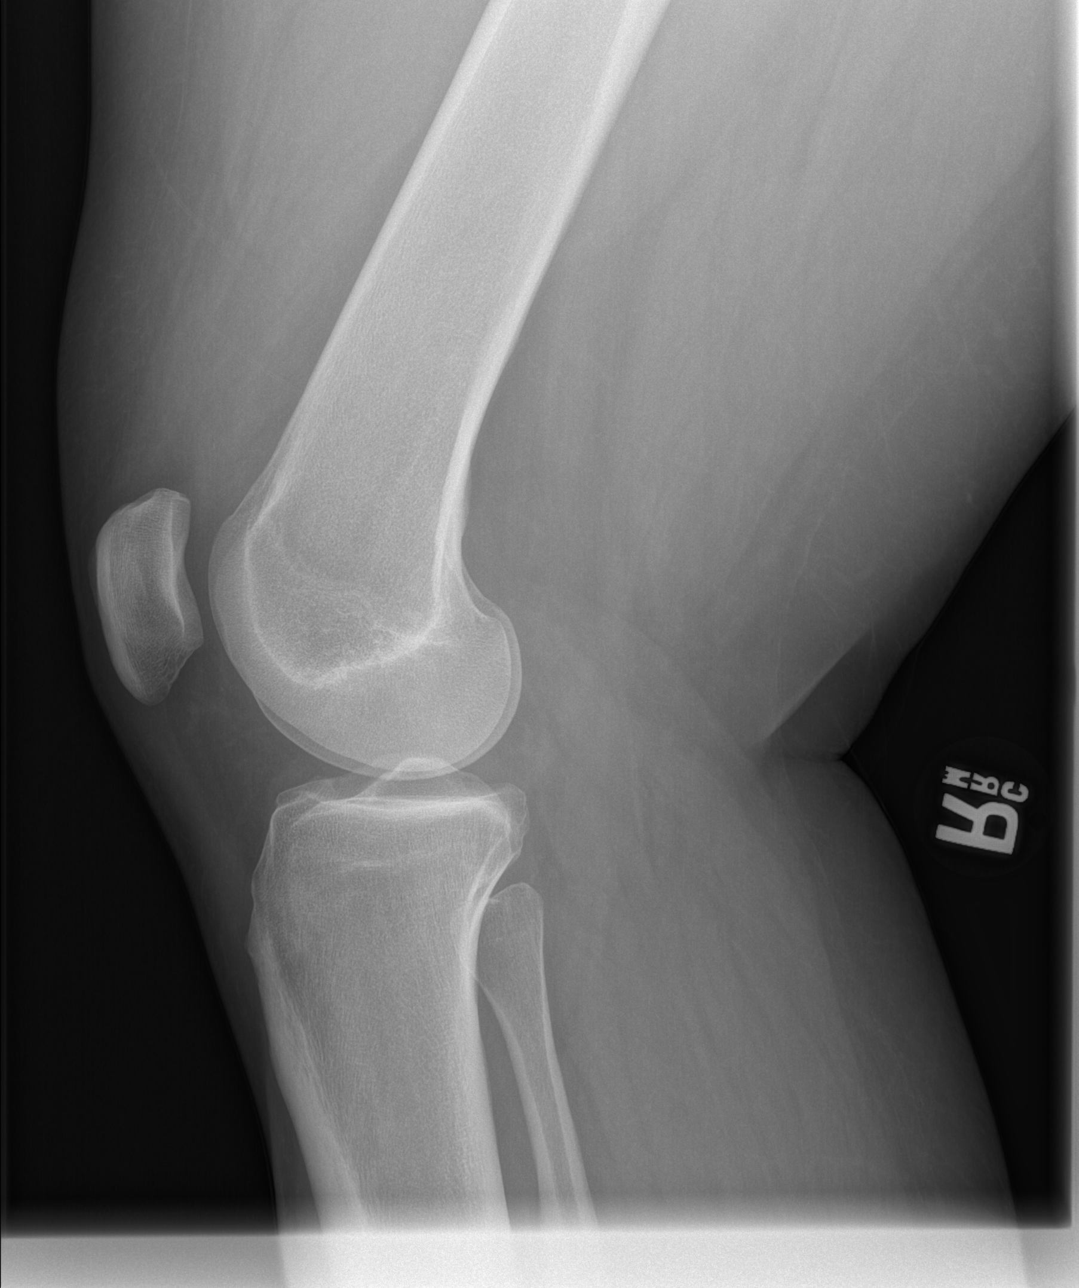

[4 of 4 positions shown; findings below may reference images not displayed]

FINDINGS: No evidence of fracture, dislocation, or joint effusion. No evidence
of arthropathy or other focal bone abnormality. Soft tissues are
unremarkable.
IMPRESSION: No acute osseous abnormality.

## 2021-11-30 ENCOUNTER — Ambulatory Visit: Payer: Self-pay | Admitting: Family Medicine

## 2022-01-31 ENCOUNTER — Ambulatory Visit: Payer: Self-pay | Admitting: Family Medicine

## 2022-02-21 NOTE — Congregational Nurse Program (Signed)
  Dept: [place] Nurse Program Note  Date of Encounter: 02/21/2022  Past Medical History: No past medical history on file.  Encounter Details:  CNP Questionnaire - 02/21/22 1423       Questionnaire   Ask client: Do you give verbal consent for me to treat you today? Yes    Student Assistance N/A    Location Patient Film/video editor, [employer]    Visit Setting with Client Organization    Patient Status Unhoused    Insurance Uninsured ([facility]/Care Connects/Self-Pay/Medicaid Family Planning)    Insurance/Financial Assistance Referral [facility]    Medication N/A    Medical Provider No    Screening Referrals Made N/A    Medical Referrals Made [facility]    Medical Appointment Made N/A    Recently w/o PCP, now 1st time PCP visit completed due to CNs referral or appointment made N/A    Food Have Food Insecurities    Transportation Need transportation assistance    Housing/Utilities No permanent housing    Interpersonal Safety Do not feel safe at current residence    Interventions [facility];Counsel    Abnormal to Normal Screening Since Last CN Visit N/A    Screenings CN Performed N/A    Sent Client to Lab for: N/A    Did client attend any of the following based off CNs referral or appointments made? N/A    ED Visit Averted N/A    Life-Saving Intervention Made N/A             Client into nurse only clinic requesting assistance with glasses. Agrees to visit. Is homeless "couch-surfing" with friend/family. Does not feel entirely safe @ the current [employer] apt. Friend providing transportation. In a hurry today so will return in 1 week for screenings and assistance with [facility] application. Completed and submitted [facility] [place] application for Vision resources. Scheduled return visit in 1 week to address other identified needs. Rhermann, RN

## 2022-02-28 DIAGNOSIS — Z139 Encounter for screening, unspecified: Secondary | ICD-10-CM

## 2022-02-28 LAB — GLUCOSE, POCT (MANUAL RESULT ENTRY): POC Glucose: 85 mg/dl (ref 70–99)

## 2022-02-28 NOTE — Congregational Nurse Program (Signed)
  Dept: [place] Nurse Program Note  Date of Encounter: 02/28/2022  Past Medical History: No past medical history on file.  Encounter Details:  CNP Questionnaire - 02/28/22 1145       Questionnaire   Ask client: Do you give verbal consent for me to treat you today? Yes    Student Assistance N/A    Location Patient Film/video editor, [employer]    Visit Setting with Client Organization    Patient Status Unhoused    Insurance Uninsured ([facility] [employer]/Care Connects/Self-Pay/Medicaid Family Planning)    Insurance/Financial Assistance Referral [facility] Financial Assistance    Medication Have Medication Insecurities    Medical Provider No    Screening Referrals Made Annual Wellness Visit    Medical Referrals Made [facility] PCP/Clinic    Medical Appointment Made N/A    Recently w/o PCP, now 1st time PCP visit completed due to CNs referral or appointment made N/A    Food Have Food Insecurities    Transportation Need transportation assistance    Housing/Utilities No permanent housing    Interpersonal Safety Do not feel safe at current residence    Interventions [facility]/Support;[facility]igate [facility];Educate    Abnormal to Normal Screening Since Last CN Visit N/A    Screenings CN Performed Blood Pressure;Blood Glucose    Sent Client to Lab for: N/A    Did client attend any of the following based off CNs referral or appointments made? N/A    ED Visit Averted N/A    Life-Saving Intervention Made N/A           Client into nurse only clinic to have BP and glucose screening and to complete applications for [facility] clinics. Forms completed and delivered. Client counseled to provide food stamp eligibility letter for open door application. Uninsured. Co re: upcoming changes in Medicaid and referred to apply in December. States she has no food so has not eaten yet today. Pantry visit today plus provided information about other food  pantries in community. Glucose 85 fasting today. Co re results. BP 142/82. RTC in 1-2 wks to follow up on last week's vision referral and repeat BP screening. Co. Re: upcoming flu shot clinic. Olene Craven, RN

## 2022-09-07 ENCOUNTER — Encounter: Payer: Self-pay | Admitting: Emergency Medicine

## 2022-09-07 ENCOUNTER — Emergency Department: Payer: 59

## 2022-09-07 ENCOUNTER — Emergency Department
Admission: EM | Admit: 2022-09-07 | Discharge: 2022-09-08 | Disposition: A | Discharge status: Home / Self Care | Payer: 59 | Attending: Emergency Medicine | Admitting: Emergency Medicine

## 2022-09-07 DIAGNOSIS — M25561 Pain in right knee: Secondary | ICD-10-CM

## 2022-09-07 DIAGNOSIS — R0789 Other chest pain: Secondary | ICD-10-CM

## 2022-09-07 DIAGNOSIS — Z743 Need for continuous supervision: Secondary | ICD-10-CM | POA: Diagnosis not present

## 2022-09-07 DIAGNOSIS — S80919A Unspecified superficial injury of unspecified knee, initial encounter: Secondary | ICD-10-CM | POA: Diagnosis not present

## 2022-09-07 DIAGNOSIS — M25562 Pain in left knee: Secondary | ICD-10-CM | POA: Insufficient documentation

## 2022-09-07 DIAGNOSIS — R079 Chest pain, unspecified: Secondary | ICD-10-CM | POA: Diagnosis not present

## 2022-09-07 DIAGNOSIS — E039 Hypothyroidism, unspecified: Secondary | ICD-10-CM | POA: Diagnosis not present

## 2022-09-07 DIAGNOSIS — Z23 Encounter for immunization: Secondary | ICD-10-CM | POA: Diagnosis not present

## 2022-09-07 DIAGNOSIS — Y92410 Unspecified street and highway as the place of occurrence of the external cause: Secondary | ICD-10-CM | POA: Insufficient documentation

## 2022-09-07 HISTORY — DX: Hypothyroidism, unspecified: E03.9

## 2022-09-07 MED ORDER — ONDANSETRON HCL 4 MG/2ML IJ SOLN
4.0000 mg | Freq: Once | INTRAMUSCULAR | Status: AC
  Administered 2022-09-08: 4 mg via INTRAVENOUS
  Filled 2022-09-07: qty 2

## 2022-09-07 MED ORDER — MORPHINE SULFATE (PF) 4 MG/ML IV SOLN
4.0000 mg | Freq: Once | INTRAVENOUS | Status: AC
  Administered 2022-09-08: 4 mg via INTRAVENOUS
  Filled 2022-09-07: qty 1

## 2022-09-07 MED ORDER — SODIUM CHLORIDE 0.9 % IV BOLUS (SEPSIS)
1000.0000 mL | Freq: Once | INTRAVENOUS | Status: AC
  Administered 2022-09-08: 1000 mL via INTRAVENOUS

## 2022-09-07 MED ORDER — TETANUS-DIPHTH-ACELL PERTUSSIS 5-2.5-18.5 LF-MCG/0.5 IM SUSY
0.5000 mL | PREFILLED_SYRINGE | Freq: Once | INTRAMUSCULAR | Status: AC
  Administered 2022-09-08: 0.5 mL via INTRAMUSCULAR
  Filled 2022-09-07: qty 0.5
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
## 2022-09-08 ENCOUNTER — Emergency Department: Payer: No Typology Code available for payment source

## 2022-09-08 LAB — CBC WITH DIFFERENTIAL/PLATELET
Abs Immature Granulocytes: 0.04 10*3/uL (ref 0.00–0.07)
Basophils Absolute: 0 10*3/uL (ref 0.0–0.1)
Basophils Relative: 0 %
Eosinophils Absolute: 0 10*3/uL (ref 0.0–0.5)
Eosinophils Relative: 0 %
HCT: 31.8 % — ABNORMAL LOW (ref 36.0–46.0)
Hemoglobin: 9.6 g/dL — ABNORMAL LOW (ref 12.0–15.0)
Immature Granulocytes: 0 %
Lymphocytes Relative: 14 %
Lymphs Abs: 1.3 10*3/uL (ref 0.7–4.0)
MCH: 24.7 pg — ABNORMAL LOW (ref 26.0–34.0)
MCHC: 30.2 g/dL (ref 30.0–36.0)
MCV: 81.7 fL (ref 80.0–100.0)
Monocytes Absolute: 0.7 10*3/uL (ref 0.1–1.0)
Monocytes Relative: 7 %
Neutro Abs: 7.7 10*3/uL (ref 1.7–7.7)
Neutrophils Relative %: 79 %
Platelets: 329 10*3/uL (ref 150–400)
RBC: 3.89 MIL/uL (ref 3.87–5.11)
RDW: 17.6 % — ABNORMAL HIGH (ref 11.5–15.5)
WBC: 9.8 10*3/uL (ref 4.0–10.5)
nRBC: 0 % (ref 0.0–0.2)

## 2022-09-08 LAB — COMPREHENSIVE METABOLIC PANEL
ALT: 15 U/L (ref 0–44)
AST: 21 U/L (ref 15–41)
Albumin: 3.6 g/dL (ref 3.5–5.0)
Alkaline Phosphatase: 47 U/L (ref 38–126)
Anion gap: 7 (ref 5–15)
BUN: 11 mg/dL (ref 6–20)
CO2: 24 mmol/L (ref 22–32)
Calcium: 8.8 mg/dL — ABNORMAL LOW (ref 8.9–10.3)
Chloride: 105 mmol/L (ref 98–111)
Creatinine, Ser: 0.86 mg/dL (ref 0.44–1.00)
GFR, Estimated: >60 mL/min (ref >60)
Glucose, Bld: 103 mg/dL — ABNORMAL HIGH (ref 70–99)
Potassium: 3.6 mmol/L (ref 3.5–5.1)
Sodium: 136 mmol/L (ref 135–145)
Total Bilirubin: 0.5 mg/dL (ref 0.3–1.2)
Total Protein: 8.7 g/dL — ABNORMAL HIGH (ref 6.5–8.1)

## 2022-09-08 LAB — TROPONIN I (HIGH SENSITIVITY): Troponin I (High Sensitivity): 6 ng/L (ref <18)

## 2022-09-08 MED ORDER — HYDROMORPHONE HCL 1 MG/ML IJ SOLN
1.0000 mg | Freq: Once | INTRAMUSCULAR | Status: AC
  Administered 2022-09-08: 1 mg via INTRAVENOUS
  Filled 2022-09-08: qty 1

## 2022-09-08 MED ORDER — ONDANSETRON 4 MG PO TBDP: 4.0000 mg | ORAL_TABLET | Freq: Four times a day (QID) | ORAL | 0 refills | Status: AC | PRN

## 2022-09-08 MED ORDER — IOHEXOL 300 MG/ML  SOLN
100.0000 mL | Freq: Once | INTRAMUSCULAR | Status: AC | PRN
  Administered 2022-09-08: 100 mL via INTRAVENOUS

## 2022-09-08 MED ORDER — IBUPROFEN 800 MG PO TABS: 800.0000 mg | ORAL_TABLET | Freq: Three times a day (TID) | ORAL | 0 refills | Status: AC | PRN

## 2022-09-08 MED ORDER — OXYCODONE-ACETAMINOPHEN 5-325 MG PO TABS: 2.0000 | ORAL_TABLET | Freq: Four times a day (QID) | ORAL | 0 refills | Status: AC | PRN

## 2022-09-08 NOTE — ED Notes (Signed)
E-signature pad unavailable - Pt verbalized understanding of D/C information - no additional concerns at this time.  

## 2022-09-08 NOTE — Discharge Instructions (Signed)

## 2022-10-23 ENCOUNTER — Ambulatory Visit: Payer: Self-pay

## 2022-11-20 ENCOUNTER — Ambulatory Visit: Payer: Self-pay

## 2023-01-31 ENCOUNTER — Ambulatory Visit: Payer: Self-pay

## 2023-02-19 ENCOUNTER — Ambulatory Visit: Payer: Self-pay | Admitting: Family Medicine

## 2023-02-19 ENCOUNTER — Ambulatory Visit: Payer: Self-pay

## 2023-02-19 ENCOUNTER — Encounter: Payer: Self-pay | Admitting: Family Medicine

## 2023-02-19 DIAGNOSIS — Z87898 Personal history of other specified conditions: Secondary | ICD-10-CM

## 2023-02-19 DIAGNOSIS — Z113 Encounter for screening for infections with a predominantly sexual mode of transmission: Secondary | ICD-10-CM

## 2023-02-19 DIAGNOSIS — A599 Trichomoniasis, unspecified: Secondary | ICD-10-CM

## 2023-02-19 LAB — HM HIV SCREENING LAB: HM HIV Screening: NEGATIVE

## 2023-02-19 LAB — WET PREP FOR TRICH, YEAST, CLUE
Trichomonas Exam: POSITIVE — AB
Yeast Exam: NEGATIVE

## 2023-02-19 MED ORDER — METRONIDAZOLE 500 MG PO TABS: 500.0000 mg | ORAL_TABLET | Freq: Two times a day (BID) | ORAL | Status: AC

## 2023-02-19 NOTE — Progress Notes (Signed)
Pt is here for STD screening.  Wet mount results reviewed.  The patient was dispensed Metronidazole 500 mg #14 today. I provided counseling today regarding the medication. We discussed the medication, the side effects and when to call clinic. Patient given the opportunity to ask questions. Questions answered.  .mecrd
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
## 2023-02-19 NOTE — Addendum Note (Signed)
Addended by: Lenice Llamas on: 02/19/2023 04:04 PM   Modules accepted: Orders

## 2023-02-28 ENCOUNTER — Telehealth: Payer: Self-pay

## 2023-02-28 NOTE — Telephone Encounter (Signed)
Unable to accept calls at this time.

## 2023-03-05 ENCOUNTER — Telehealth: Payer: Self-pay

## 2023-03-05 NOTE — Telephone Encounter (Signed)
Unable to accept calls at this time. Letter mailed. Berdie Ogren, RN

## 2023-05-02 NOTE — Progress Notes (Signed)
05-02-2023 [facility] ([facility]) received a request for medical records from [employer] for patient Cassie Levine, dob: [birth year] for medical records dates of service 05-20-2021 to present. 04-11-2023 Per request, [facility] medical records faxed to [employer] at  fax number [phone]. [facility] records released include office visit notes, HIV, Chlamydia, GC lab report from service date 02-19-2023. Records faxed by [facility] medical records clerk [place]. See copy of ROI scanned in record. Herby Abraham RN.

## 2023-07-20 ENCOUNTER — Emergency Department: Payer: Self-pay

## 2023-07-20 ENCOUNTER — Other Ambulatory Visit: Payer: Self-pay

## 2023-07-20 ENCOUNTER — Emergency Department
Admission: EM | Admit: 2023-07-20 | Discharge: 2023-07-20 | Disposition: A | Discharge status: Home / Self Care | Payer: Self-pay | Attending: Emergency Medicine | Admitting: Emergency Medicine

## 2023-07-20 DIAGNOSIS — J40 Bronchitis, not specified as acute or chronic: Secondary | ICD-10-CM | POA: Diagnosis not present

## 2023-07-20 DIAGNOSIS — B009 Herpesviral infection, unspecified: Secondary | ICD-10-CM | POA: Insufficient documentation

## 2023-07-20 DIAGNOSIS — R0789 Other chest pain: Secondary | ICD-10-CM | POA: Diagnosis not present

## 2023-07-20 DIAGNOSIS — E039 Hypothyroidism, unspecified: Secondary | ICD-10-CM | POA: Diagnosis not present

## 2023-07-20 DIAGNOSIS — R0602 Shortness of breath: Secondary | ICD-10-CM | POA: Diagnosis not present

## 2023-07-20 DIAGNOSIS — I2693 Single subsegmental pulmonary embolism without acute cor pulmonale: Secondary | ICD-10-CM | POA: Diagnosis not present

## 2023-07-20 DIAGNOSIS — J209 Acute bronchitis, unspecified: Secondary | ICD-10-CM | POA: Diagnosis not present

## 2023-07-20 LAB — COMPREHENSIVE METABOLIC PANEL
ALT: 16 U/L (ref 0–44)
AST: 25 U/L (ref 15–41)
Albumin: 3.5 g/dL (ref 3.5–5.0)
Alkaline Phosphatase: 49 U/L (ref 38–126)
Anion gap: 6 (ref 5–15)
BUN: 8 mg/dL (ref 6–20)
CO2: 26 mmol/L (ref 22–32)
Calcium: 8.7 mg/dL — ABNORMAL LOW (ref 8.9–10.3)
Chloride: 102 mmol/L (ref 98–111)
Creatinine, Ser: 0.78 mg/dL (ref 0.44–1.00)
GFR, Estimated: >60 mL/min (ref >60)
Glucose, Bld: 110 mg/dL — ABNORMAL HIGH (ref 70–99)
Potassium: 3.9 mmol/L (ref 3.5–5.1)
Sodium: 134 mmol/L — ABNORMAL LOW (ref 135–145)
Total Bilirubin: 1.4 mg/dL — ABNORMAL HIGH (ref 0.0–1.2)
Total Protein: 8.4 g/dL — ABNORMAL HIGH (ref 6.5–8.1)

## 2023-07-20 LAB — CBC WITH DIFFERENTIAL/PLATELET
Abs Immature Granulocytes: 0.06 10*3/uL (ref 0.00–0.07)
Basophils Absolute: 0 10*3/uL (ref 0.0–0.1)
Basophils Relative: 0 %
Eosinophils Absolute: 0 10*3/uL (ref 0.0–0.5)
Eosinophils Relative: 1 %
HCT: 30.6 % — ABNORMAL LOW (ref 36.0–46.0)
Hemoglobin: 9.6 g/dL — ABNORMAL LOW (ref 12.0–15.0)
Immature Granulocytes: 1 %
Lymphocytes Relative: 19 %
Lymphs Abs: 1.2 10*3/uL (ref 0.7–4.0)
MCH: 26.4 pg (ref 26.0–34.0)
MCHC: 31.4 g/dL (ref 30.0–36.0)
MCV: 84.1 fL (ref 80.0–100.0)
Monocytes Absolute: 0.5 10*3/uL (ref 0.1–1.0)
Monocytes Relative: 7 %
Neutro Abs: 4.4 10*3/uL (ref 1.7–7.7)
Neutrophils Relative %: 72 %
Platelets: 307 10*3/uL (ref 150–400)
RBC: 3.64 MIL/uL — ABNORMAL LOW (ref 3.87–5.11)
RDW: 16.9 % — ABNORMAL HIGH (ref 11.5–15.5)
WBC: 6.1 10*3/uL (ref 4.0–10.5)
nRBC: 0 % (ref 0.0–0.2)

## 2023-07-20 LAB — TROPONIN I (HIGH SENSITIVITY): Troponin I (High Sensitivity): 4 ng/L (ref <18)

## 2023-07-20 LAB — D-DIMER, QUANTITATIVE: D-Dimer, Quant: 0.57 ug{FEU}/mL — ABNORMAL HIGH (ref 0.00–0.50)

## 2023-07-20 MED ORDER — VALACYCLOVIR HCL 1 G PO TABS: 1000.0000 mg | ORAL_TABLET | Freq: Three times a day (TID) | ORAL | 0 refills | Status: AC

## 2023-07-20 MED ORDER — PREDNISONE 10 MG PO TABS: 50.0000 mg | ORAL_TABLET | Freq: Every day | ORAL | 0 refills | Status: AC

## 2023-07-20 MED ORDER — IOHEXOL 350 MG/ML SOLN
75.0000 mL | Freq: Once | INTRAVENOUS | Status: AC | PRN
  Administered 2023-07-20: 75 mL via INTRAVENOUS

## 2023-07-20 MED ORDER — RIVAROXABAN (XARELTO) VTE STARTER PACK (15 & 20 MG): ORAL_TABLET | ORAL | 0 refills | Status: AC

## 2023-07-20 NOTE — ED Notes (Signed)
 See triage notes. Patient c/o facial swelling and oral pain since yesterday. Patient also c/o mild shortness of breath and raspy/gravelly voice since this morning.

## 2023-07-20 NOTE — ED Notes (Signed)
 Patient called out. Patient had dropped her cell phone on the floor and needed help retrieving it. Patient also wanted the other side rail up on her bed. Both needs/wants were provided to the patient.

## 2023-07-20 NOTE — ED Triage Notes (Signed)
 Left jaw pain and swelling since yesterday. Denies any injury or trauma

## 2023-07-20 NOTE — ED Provider Notes (Signed)
 [facility] Provider Note    Event Date/Time   First MD Initiated Contact with Patient 07/20/23 1145     (approximate)   History   Facial Swelling   HPI  Cassie Levine is a 53 y.o. female  with history of hypothyroidism and as listed in EMR presents to the emergency department for evaluation of shortness of breath and left side rib pain that started this morning. Pain worsens with deep breath and movement. She is also having soreness and swelling on the inside of her left cheek and tongue that seems to be worse today than yesterday. No dental pain.  Also of note, patient is under a significant amount of stress and sadness due to recent death of daughter.     Physical Exam   Triage Vital Signs: ED Triage Vitals  Encounter Vitals Group     BP 07/20/23 1118 127/82     Systolic BP Percentile --      Diastolic BP Percentile --      Pulse Rate 07/20/23 1118 78     Resp 07/20/23 1118 17     Temp 07/20/23 1118 98.7 F (37.1 C)     Temp Source 07/20/23 1118 Oral     SpO2 07/20/23 1118 100 %     Weight --      Height 07/20/23 1120 6' (1.829 m)     Head Circumference --      Peak Flow --      Pain Score 07/20/23 1119 10     Pain Loc --      Pain Education --      Exclude from Growth Chart --     Most recent vital signs: Vitals:   07/20/23 1118 07/20/23 1425  BP: 127/82 124/82  Pulse: 78 76  Resp: 17 17  Temp: 98.7 F (37.1 C)   SpO2: 100% 100%    General: Awake, no distress.  CV:  Good peripheral perfusion.  Resp:  Normal effort. Breath sounds clear. No indication of pleuritic pain during exam. Abd:  No distention. Non-tender Other:  Lesions appearing as HSV 1 on left buccal surface, tongue, and lower lip   ED Results / Procedures / Treatments   Labs (all labs ordered are listed, but only abnormal results are displayed) Labs Reviewed  CBC WITH DIFFERENTIAL/PLATELET - Abnormal; Notable for the following components:      Result  Value   RBC 3.64 (*)    Hemoglobin 9.6 (*)    HCT 30.6 (*)    RDW 16.9 (*)    All other components within normal limits  D-DIMER, QUANTITATIVE - Abnormal; Notable for the following components:   D-Dimer, Quant 0.57 (*)    All other components within normal limits  COMPREHENSIVE METABOLIC PANEL - Abnormal; Notable for the following components:   Sodium 134 (*)    Glucose, Bld 110 (*)    Calcium 8.7 (*)    Total Protein 8.4 (*)    Total Bilirubin 1.4 (*)    All other components within normal limits  TROPONIN I (HIGH SENSITIVITY)  TROPONIN I (HIGH SENSITIVITY)     EKG  Sinus rhythm with PACs.   RADIOLOGY  Image and radiology report reviewed and interpreted by me. Radiology report consistent with the same.  Chest x-ray negative for acute cardiopulmonary abnormality.  CTA chest shows a solitary foci of subsegmental pulmonary emboli on the right lower lobe as well as evidence of bronchitis  PROCEDURES:  Critical Care performed:  No  Procedures   MEDICATIONS ORDERED IN ED:  Medications  iohexol (OMNIPAQUE) 350 MG/ML injection 75 mL (75 mLs Intravenous Contrast Given 07/20/23 1339)     IMPRESSION / MDM / ASSESSMENT AND PLAN / ED COURSE   I have reviewed the triage note.  Differential diagnosis includes, but is not limited to, Herpes simplex 1, buccal trauma, musculoskeletal pain, PE  Patient's presentation is most consistent with acute presentation with potential threat to life or bodily function.  53 year old female presenting to the emergency department for treatment and evaluation of shortness of breath, left-sided chest wall pain, and lesions inside her mouth.  See HPI for further details.  Lesions in the mouth and tongue appear as HSV 1.  .  On exam, she is having pleuritic pain.  Plan will be to get some labs including D-dimer and EKG.  EKG without acute concerns but does show some occasional PACs.  Lab studies show a stable anemia with a hemoglobin of 9.6 and  hematocrit of 30.6.  CMP is unremarkable.  D-dimer is elevated at 0.57.  Chest x-ray is without acute findings.  Due to elevation of D-dimer, CTA chest ordered.  CT of the chest shows a possible small subsegmental pulmonary embolus but it is in the right lower lobe and the patient's pain is in the left.  Either way, she will be started on Xarelto  and given follow-up information.  Chest CT also indicates bronchitis.  She will be treated with prednisone.   All results and plan were discussed with the patient.  Medication teaching regarding blood thinner was given and she is aware she should return to the emergency department if she sustains any falls or strikes her head in any way.  She was encouraged to return to the emergency department for any symptoms that changes or gets worse if she is unable to be evaluated by primary care.  CRITICAL CARE Performed by: Kem Boroughs   Total critical care time: 45 minutes  Critical care time was exclusive of separately billable procedures and treating other patients.  Critical care was necessary to treat or prevent imminent or life-threatening deterioration.  Critical care was time spent personally by me on the following activities: development of treatment plan with patient and/or surrogate as well as nursing, discussions with consultants, evaluation of patient's response to treatment, examination of patient, obtaining history from patient or surrogate, ordering and performing treatments and interventions, ordering and review of laboratory studies, ordering and review of radiographic studies, pulse oximetry and re-evaluation of patient's condition.       FINAL CLINICAL IMPRESSION(S) / ED DIAGNOSES   Final diagnoses:  HSV-1 infection  Single subsegmental pulmonary embolism without acute cor pulmonale (HCC)  Bronchitis     Rx / DC Orders   ED Discharge Orders          Ordered    RIVAROXABAN (XARELTO) VTE STARTER PACK (15 & 20 MG)         07/20/23 1526    valACYclovir (VALTREX) 1000 MG tablet  3 times daily        07/20/23 1526    predniSONE (DELTASONE) 10 MG tablet  Daily        07/20/23 1526             Note:  This document was prepared using Dragon voice recognition software and may include unintentional dictation errors.   Chinita Pester, FNP 07/20/23 1531    Jene Every, MD 07/20/23 1536

## 2023-07-20 NOTE — Discharge Instructions (Signed)
 Please call and schedule follow-up appointment with pulmonology. Take your medications as prescribed and until finished.  For the sores in your mouth, you may also buy Anbesol or similar numbing agent which is sold over-the-counter at the pharmacy.  Return to the emergency department immediately if you begin to feel chest pain, shortness of breath, or your symptoms today worsen

## 2023-11-29 ENCOUNTER — Ambulatory Visit

## 2023-11-30 ENCOUNTER — Emergency Department: Payer: Self-pay

## 2023-11-30 ENCOUNTER — Other Ambulatory Visit: Payer: Self-pay

## 2023-11-30 ENCOUNTER — Emergency Department
Admission: EM | Admit: 2023-11-30 | Discharge: 2023-12-01 | Disposition: A | Discharge status: Home / Self Care | Payer: Self-pay | Attending: Emergency Medicine | Admitting: Emergency Medicine

## 2023-11-30 DIAGNOSIS — R0789 Other chest pain: Secondary | ICD-10-CM | POA: Insufficient documentation

## 2023-11-30 DIAGNOSIS — R059 Cough, unspecified: Secondary | ICD-10-CM | POA: Insufficient documentation

## 2023-11-30 DIAGNOSIS — R5383 Other fatigue: Secondary | ICD-10-CM | POA: Diagnosis not present

## 2023-11-30 DIAGNOSIS — E039 Hypothyroidism, unspecified: Secondary | ICD-10-CM | POA: Insufficient documentation

## 2023-11-30 DIAGNOSIS — R519 Headache, unspecified: Secondary | ICD-10-CM | POA: Insufficient documentation

## 2023-11-30 LAB — CBC
HCT: 31.6 % — ABNORMAL LOW (ref 36.0–46.0)
Hemoglobin: 10.1 g/dL — ABNORMAL LOW (ref 12.0–15.0)
MCH: 27.2 pg (ref 26.0–34.0)
MCHC: 32 g/dL (ref 30.0–36.0)
MCV: 84.9 fL (ref 80.0–100.0)
Platelets: 279 K/uL (ref 150–400)
RBC: 3.72 MIL/uL — ABNORMAL LOW (ref 3.87–5.11)
RDW: 17.3 % — ABNORMAL HIGH (ref 11.5–15.5)
WBC: 6.9 K/uL (ref 4.0–10.5)
nRBC: 0 % (ref 0.0–0.2)

## 2023-11-30 LAB — BASIC METABOLIC PANEL WITH GFR
Anion gap: 7 (ref 5–15)
BUN: 17 mg/dL (ref 6–20)
CO2: 25 mmol/L (ref 22–32)
Calcium: 8.8 mg/dL — ABNORMAL LOW (ref 8.9–10.3)
Chloride: 104 mmol/L (ref 98–111)
Creatinine, Ser: 1.13 mg/dL — ABNORMAL HIGH (ref 0.44–1.00)
GFR, Estimated: 59 mL/min — ABNORMAL LOW (ref >60)
Glucose, Bld: 105 mg/dL — ABNORMAL HIGH (ref 70–99)
Potassium: 3.8 mmol/L (ref 3.5–5.1)
Sodium: 136 mmol/L (ref 135–145)

## 2023-11-30 LAB — RESP PANEL BY RT-PCR (RSV, FLU A&B, COVID)  RVPGX2
Influenza A by PCR: NEGATIVE
Influenza B by PCR: NEGATIVE
Resp Syncytial Virus by PCR: NEGATIVE
SARS Coronavirus 2 by RT PCR: NEGATIVE

## 2023-11-30 LAB — D-DIMER, QUANTITATIVE: D-Dimer, Quant: 0.36 ug{FEU}/mL (ref 0.00–0.50)

## 2023-11-30 LAB — TROPONIN I (HIGH SENSITIVITY): Troponin I (High Sensitivity): 6 ng/L (ref <18)

## 2023-11-30 MED ORDER — KETOROLAC TROMETHAMINE 15 MG/ML IJ SOLN
15.0000 mg | Freq: Once | INTRAMUSCULAR | Status: AC
  Administered 2023-11-30: 15 mg via INTRAVENOUS
  Filled 2023-11-30: qty 1

## 2023-11-30 MED ORDER — IOHEXOL 350 MG/ML SOLN
100.0000 mL | Freq: Once | INTRAVENOUS | Status: AC | PRN
  Administered 2023-11-30: 100 mL via INTRAVENOUS

## 2023-11-30 MED ORDER — SODIUM CHLORIDE 0.9 % IV BOLUS
1000.0000 mL | Freq: Once | INTRAVENOUS | Status: AC
Start: 2023-11-30 — End: 2023-12-01
  Administered 2023-11-30: 1000 mL via INTRAVENOUS

## 2023-11-30 NOTE — ED Triage Notes (Signed)
 Pt reports shortness of breath for the past 4 days. Pt reports back in March she was told she had a blood clot in her lung, pt reports symptoms she is experiencing at present are similar to what she was experiencing back in March. Pt has nasal congestion, dry cough. Pt talks in complete sentences no respiratory distress at present

## 2023-11-30 NOTE — Discharge Instructions (Addendum)
 Your labs, ultrasound of the left leg, and CT scan of the chest were all normal.  The CT scan did incidentally note several small nodules in the lung.  We have requested a referral for further evaluation, and you will be contacted to set up an appointment.  Take ibuprofen  600mg  and/or tylenol  650mg  every 6 hours as needed for headache and pain.

## 2023-11-30 NOTE — ED Provider Notes (Signed)
 [facility] Provider Note    Event Date/Time   First MD Initiated Contact with Patient 11/30/23 2219     (approximate)   History   Chief Complaint: Shortness of Breath   HPI  Cassie Levine is a 53 y.o. female with a past history of hypothyroidism who comes ED complaining of left-sided chest pain radiating around to her back, hurts to breathe, associated with productive cough, headache, fatigue, ongoing for the past 4 days.  Feels achy all over.  No rash.  No vomiting or diarrhea.  Reports that she was diagnosed with a blood clot in her lung in March, has been taking prescribed Xarelto  intermittently, and this feels somewhat similar.  Also complains of pain in the left calf.  No recent travel trauma hospitalization or surgery.  Prior CT scan report reviewed noting somewhat equivocal finding of a possible peripheral subsegmental PE.      Past Medical History:  Diagnosis Date   Hypothyroidism     Current Outpatient Rx   Order #: [id number] Class: Normal   Order #: [id number] Class: Normal   Order #: [id number] Class: Print   Order #: [id number] Class: Print   Order #: [id number] Class: Print   Order #: [id number] Class: Normal   Order #: [id number] Class: Normal   Order #: [id number] Class: Normal   Order #: [id number] Class: Normal    No past surgical history on file.  Physical Exam   Triage Vital Signs: ED Triage Vitals [11/30/23 2143]  Encounter Vitals Group     BP (!) 172/76     Girls Systolic BP Percentile      Girls Diastolic BP Percentile      Boys Systolic BP Percentile      Boys Diastolic BP Percentile      Pulse Rate 70     Resp 16     Temp 98.4 F (36.9 C)     Temp Source Oral     SpO2 100 %     Weight 209 lb 7 oz (95 kg)     Height 6' (1.829 m)     Head Circumference      Peak Flow      Pain Score 10     Pain Loc      Pain Education      Exclude from Growth Chart     Most recent vital signs: Vitals:   11/30/23 2143  BP:  (!) 172/76  Pulse: 70  Resp: 16  Temp: 98.4 F (36.9 C)  SpO2: 100%    General: Awake, no distress.  CV:  Good peripheral perfusion.  Regular rate rhythm.  No murmurs.  Normal distal pulses Resp:  Normal effort.  Clear to auscultation bilaterally Abd:  No distention.  Soft nontender Other:  Left inferolateral chest wall tender to palpation reproducing her pain   ED Results / Procedures / Treatments   Labs (all labs ordered are listed, but only abnormal results are displayed) Labs Reviewed  BASIC METABOLIC PANEL WITH GFR - Abnormal; Notable for the following components:      Result Value   Glucose, Bld 105 (*)    Creatinine, Ser 1.13 (*)    Calcium 8.8 (*)    GFR, Estimated 59 (*)    All other components within normal limits  CBC - Abnormal; Notable for the following components:   RBC 3.72 (*)    Hemoglobin 10.1 (*)    HCT 31.6 (*)    RDW 17.3 (*)    All other components within  normal limits  RESP PANEL BY RT-PCR (RSV, FLU A&B, COVID)  [id number]  D-DIMER, QUANTITATIVE  TROPONIN I (HIGH SENSITIVITY)     EKG Interpreted by me Sinus rhythm rate of 72.  Normal axis intervals QRS ST segments and T waves.  1 PVC on the strip.   RADIOLOGY Chest x-ray interpreted by me, appears normal.  Radiology report reviewed  Ultrasound left leg negative for DVT  CT chest negative for PE or other acute findings.  Does find multiple small pulmonary nodules up to 4 mm in size    PROCEDURES:  Procedures   MEDICATIONS ORDERED IN ED: Medications  ketorolac  (TORADOL ) 15 MG/ML injection 15 mg (15 mg Intravenous Given 11/30/23 2247)  sodium chloride  0.9 % bolus 1,000 mL (1,000 mLs Intravenous New Bag/Given 11/30/23 2246)  iohexol  (OMNIPAQUE ) 350 MG/ML injection 100 mL (100 mLs Intravenous Contrast Given 11/30/23 2254)     IMPRESSION / MDM / ASSESSMENT AND PLAN / ED COURSE  I reviewed the triage vital signs and the nursing notes.  DDx: Pulmonary embolism, viral respiratory  infection/influenza-like illness, COVID, influenza, RSV, non-STEMI, chest wall strain  Patient's presentation is most consistent with acute presentation with potential threat to life or bodily function.  Patient presents with left chest pain which is reproducible to touch and most likely chest wall related.  This may be due to muscle strain from coughing in the setting of a respiratory illness.  Imaging and labs are all reassuring.  Vital signs are normal, no signs of sepsis or shock.  No identifiable bacterial pneumonia.  Recommend NSAIDs for now.       FINAL CLINICAL IMPRESSION(S) / ED DIAGNOSES   Final diagnoses:  Left-sided chest wall pain     Rx / DC Orders   ED Discharge Orders          Ordered    AMB  Referral to Pulmonary Nodule Clinic        11/30/23 2327             Note:  This document was prepared using Dragon voice recognition software and may include unintentional dictation errors.   Viviann Pastor, MD 11/30/23 2333

## 2023-12-02 ENCOUNTER — Encounter: Payer: Self-pay | Admitting: Student in an Organized Health Care Education/Training Program

## 2023-12-03 ENCOUNTER — Telehealth: Payer: Self-pay | Admitting: Pulmonary Disease

## 2023-12-03 NOTE — Telephone Encounter (Signed)
 #  not in service.

## 2024-01-22 DIAGNOSIS — Z79899 Other long term (current) drug therapy: Secondary | ICD-10-CM | POA: Diagnosis not present

## 2024-01-22 DIAGNOSIS — R0602 Shortness of breath: Secondary | ICD-10-CM | POA: Diagnosis not present

## 2024-01-22 DIAGNOSIS — M546 Pain in thoracic spine: Secondary | ICD-10-CM | POA: Diagnosis not present

## 2024-01-22 DIAGNOSIS — E559 Vitamin D deficiency, unspecified: Secondary | ICD-10-CM | POA: Diagnosis not present

## 2024-01-22 DIAGNOSIS — M545 Low back pain, unspecified: Secondary | ICD-10-CM | POA: Diagnosis not present

## 2024-01-22 DIAGNOSIS — M129 Arthropathy, unspecified: Secondary | ICD-10-CM | POA: Diagnosis not present

## 2024-01-22 DIAGNOSIS — E7800 Pure hypercholesterolemia, unspecified: Secondary | ICD-10-CM | POA: Diagnosis not present
